# Patient Record
Sex: Female | Born: 2005
Health system: Southern US, Community
[De-identification: ages and names within clinical notes are randomized; demographics above are authoritative.]

## PROBLEM LIST (undated history)

## (undated) DIAGNOSIS — M25569 Pain in unspecified knee: Secondary | ICD-10-CM

## (undated) HISTORY — DX: Pain in unspecified knee: M25.569

---
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
## 2023-01-10 ENCOUNTER — Ambulatory Visit (INDEPENDENT_AMBULATORY_CARE_PROVIDER_SITE_OTHER): Payer: No Typology Code available for payment source | Admitting: Physician Assistant

## 2023-01-10 ENCOUNTER — Encounter: Payer: Self-pay | Admitting: Physician Assistant

## 2023-01-10 VITALS — BP 98/66 | HR 79 | Ht 63.0 in | Wt 139.0 lb

## 2023-01-10 DIAGNOSIS — R194 Change in bowel habit: Secondary | ICD-10-CM | POA: Diagnosis not present

## 2023-01-10 DIAGNOSIS — R1084 Generalized abdominal pain: Secondary | ICD-10-CM

## 2023-01-10 DIAGNOSIS — K625 Hemorrhage of anus and rectum: Secondary | ICD-10-CM

## 2023-01-10 DIAGNOSIS — R110 Nausea: Secondary | ICD-10-CM | POA: Diagnosis not present

## 2023-01-10 MED ORDER — DICYCLOMINE HCL 10 MG PO CAPS: ORAL_CAPSULE | ORAL | 5 refills | Status: DC

## 2023-01-10 MED ORDER — PLENVU 140 G PO SOLR: 1.0000 | ORAL | 0 refills | Status: DC

## 2023-01-10 NOTE — Progress Notes (Signed)
Chief Complaint: Abdominal pain and dark stools, rectal bleeding  HPI:  Cristina Joyce is an 17 y/o Caucasian female who was referred to me by Inda Coke, PA for a complaint of abdominal pain, rectal bleeding and dark stools as well as nausea.      06/06/2022 abdominal x-ray was normal.    10/07/2022 patient saw PCP and discussed lower abdominal pain which happens the most after eating, also trouble with constipation in the past.    Today, the patient tells me that she has a history of constipation diagnosed back in August 2023 when she had an x-ray.  She took laxatives but it took them a week to work and then she started having some bowel movements but has not continued them.  Tells me that her bowel habits radiate back-and-forth, mostly leaning towards constipation, but "they are all different shapes and sizes and colors".  What worried her is that 1 day she saw a dark "puddley mucousy stool".  The other time that worried her was when she saw "an ibuprofen I had taken the night before".  Also describes a lot of mucus over the past 2 to 3 months.  Describes previous CT done at outside facility and told she had ovarian cyst.  Tells me that all of the symptoms have increased over the past year or so and pretty much anytime she eats just about anything she will develop lower abdominal pain within sometimes 5 to 10 minutes of eating which feels "full and uneasy".  She will feel like she has to have a bowel movement but oftentimes cannot.  Also has occasional diarrhea.  Associated symptoms include nausea.  Also seeing some bright red blood in her stool.  Typically just a little bit on the toilet paper when wiping.    Mom denies family history of colon cancer, IBD or celiac disease.  Patient is in business school but tells me she does not have a lot of anxiety or stress.    Denies fever, chills, weight loss, vomiting or symptoms that awaken her from sleep.  Past Medical History:  Diagnosis Date   Knee  pain     History reviewed. No pertinent surgical history.  Current Outpatient Medications  Medication Sig Dispense Refill   docusate sodium (COLACE) 100 MG capsule Take 100 mg by mouth 2 (two) times daily.     No current facility-administered medications for this visit.    Allergies as of 01/10/2023   (No Known Allergies)    Family History  Problem Relation Age of Onset   Asthma Mother    Depression Mother    Diabetes Father    High Cholesterol Father    High blood pressure Father    Depression Sister    Asthma Brother    Arthritis Maternal Grandmother    Cancer Maternal Grandmother        unsure of type   Depression Maternal Grandmother    Heart disease Maternal Grandmother    High Cholesterol Maternal Grandmother    High blood pressure Maternal Grandmother    Kidney disease Maternal Grandmother    Cancer Maternal Grandfather        unsure of type; possible bone?   Heart attack Maternal Grandfather    Heart disease Maternal Grandfather    High Cholesterol Maternal Grandfather    Heart disease Paternal Grandfather    High Cholesterol Paternal Grandfather     Social History   Socioeconomic History   Marital status: Single  Spouse name: Not on file   Number of children: 0   Years of education: Not on file   Highest education level: Not on file  Occupational History   Occupation: student  Tobacco Use   Smoking status: Never   Smokeless tobacco: Never  Vaping Use   Vaping Use: Not on file  Substance and Sexual Activity   Alcohol use: No   Drug use: No   Sexual activity: Never  Other Topics Concern   Not on file  Social History Narrative   [employer] -- studying business   Social Determinants of Health   Financial Resource Strain: Not on file  Food Insecurity: Not on file  Transportation Needs: Not on file  Physical Activity: Not on file  Stress: Not on file  Social Connections: Not on file  Intimate Partner Violence: Not on file     Review of Systems:    Constitutional: No weight loss, fever or chills Skin: No rash Cardiovascular: No chest pain  Respiratory: No SOB  Gastrointestinal: See HPI and otherwise negative Genitourinary: No dysuria Neurological: No headache, dizziness or syncope Musculoskeletal: No new muscle or joint pain Hematologic: No bruising Psychiatric: No history of depression or anxiety    Physical Exam:  Vital signs: BP 98/66   Pulse 79   Ht 5\' 3"  (1.6 m)   Wt 139 lb (63 kg)   LMP 12/19/2022   BMI 24.62 kg/m   Constitutional:   Pleasant Caucasian female appears to be in NAD, Well developed, Well nourished, alert and cooperative Head:  Normocephalic and atraumatic. Eyes:   PEERL, EOMI. No icterus. Conjunctiva pink. Ears:  Normal auditory acuity. Neck:  Supple Throat: Oral cavity and pharynx without inflammation, swelling or lesion.  Respiratory: Respirations even and unlabored. Lungs clear to auscultation bilaterally.   No wheezes, crackles, or rhonchi.  Cardiovascular: Normal S1, S2. No MRG. Regular rate and rhythm. No peripheral edema, cyanosis or pallor.  Gastrointestinal:  Soft, nondistended, nontender. No rebound or guarding. Normal bowel sounds. No appreciable masses or hepatomegaly. Rectal:  Declined Msk:  Symmetrical without gross deformities. Without edema, no deformity or joint abnormality.  Neurologic:  Alert and  oriented x4;  grossly normal neurologically.  Skin:   Dry and intact without significant lesions or rashes. Psychiatric: Demonstrates good judgement and reason without abnormal affect or behaviors.  RELEVANT LABS AND IMAGING: CBC    Component Value Date/Time   WBC 5.7 10/30/2022 1115   RBC 5.21 10/30/2022 1115   HGB 13.2 10/30/2022 1115   HCT 40.5 10/30/2022 1115   PLT 265.0 10/30/2022 1115   MCV 77.7 (L) 10/30/2022 1115   MCHC 32.6 10/30/2022 1115   RDW 13.8 10/30/2022 1115   LYMPHSABS 2.2 10/30/2022 1115   MONOABS 0.5 10/30/2022 1115   EOSABS 0.1  10/30/2022 1115   BASOSABS 0.0 10/30/2022 1115    CMP     Component Value Date/Time   NA 139 10/30/2022 1115   K 4.3 10/30/2022 1115   CL 106 10/30/2022 1115   CO2 26 10/30/2022 1115   GLUCOSE 81 10/30/2022 1115   BUN 11 10/30/2022 1115   CREATININE 0.74 10/30/2022 1115   CALCIUM 9.6 10/30/2022 1115   PROT 7.1 10/30/2022 1115   ALBUMIN 4.7 10/30/2022 1115   AST 16 10/30/2022 1115   ALT 12 10/30/2022 1115   ALKPHOS 73 10/30/2022 1115   BILITOT 0.3 10/30/2022 1115    Assessment: 1.  Lower abdominal pain: For the past 1 to 2 years, worse  over the past 2 to 3 months, within 10 to 15 minutes of eating associated with nausea and a variance in bowel habits and some undigested food material, denies anxiety or stress; consider IBS versus IBD versus other 2.  Variance in bowel habits 3.  Nausea 4.  Rectal bleeding: a small amount" on toilet paper a few times; likely hemorrhoids  Plan: 1.  Discussed options with the patient.  Either treating empirically for suspected IBS versus EGD/colonoscopy.  Her and her mother would like procedures to make sure nothing else is going on.  Schedule patient for diagnostic EGD and colonoscopy in the [place] with Dr. Silverio Decamp.  Did provide the patient a detailed list risk of procedures and she agrees to proceed. Patient is appropriate for endoscopic procedure(s) in the ambulatory ([place]) setting.  2.  Prescribed Dicyclomine 10 mg 3 times daily, 20-30 minutes before meals.  #30 with 2 refills did discuss risk of constipation and taking laxatives if needed 3.  Discussed taking Align probiotic daily 4.  Patient to follow in clinic per recommendations after time of procedures.  Ellouise Newer, PA-C [facility] 01/10/2023, 11:44 AM  Cc: Inda Coke, [place]

## 2023-01-10 NOTE — Patient Instructions (Signed)
You have been scheduled for an endoscopy and colonoscopy. Please follow the written instructions given to you at your visit today. Please pick up your prep supplies at the pharmacy within the next 1-3 days. If you use inhalers (even only as needed), please bring them with you on the day of your procedure.  We have sent the following medications to your pharmacy for you to pick up at your convenience: Dicyclomine 10 mg- Take 1 tablet 20-30 minutes before meals   Please purchase the following medications over the counter and take as directed: Align-1 capsule daily  _______________________________________________________  If your blood pressure at your visit was 140/90 or greater, please contact your primary care physician to follow up on this.  _______________________________________________________  If you are age 19 or older, your body mass index should be between 23-30. Your Body mass index is 24.62 kg/m. If this is out of the aforementioned range listed, please consider follow up with your Primary Care Provider.  If you are age 19 or younger, your body mass index should be between 19-25. Your Body mass index is 24.62 kg/m. If this is out of the aformentioned range listed, please consider follow up with your Primary Care Provider.   ________________________________________________________  The [facility] providers would like to encourage you to use [facility] to communicate with providers for non-urgent requests or questions.  Due to long hold times on the telephone, sending your provider a message by [facility] may be a faster and more efficient way to get a response.  Please allow 48 business hours for a response.  Please remember that this is for non-urgent requests.  _______________________________________________________  Due to recent changes in healthcare laws, you may see the results of your imaging and laboratory studies on MyChart before your provider has had a chance to review them.   We understand that in some cases there may be results that are confusing or concerning to you. Not all laboratory results come back in the same time frame and the provider may be waiting for multiple results in order to interpret others.  Please give [place] 48 hours in order for your provider to thoroughly review all the results before contacting the office for clarification of your results.

## 2023-01-16 ENCOUNTER — Other Ambulatory Visit: Payer: Self-pay | Admitting: *Deleted

## 2023-01-16 DIAGNOSIS — R1084 Generalized abdominal pain: Secondary | ICD-10-CM

## 2023-01-16 DIAGNOSIS — K625 Hemorrhage of anus and rectum: Secondary | ICD-10-CM

## 2023-01-16 DIAGNOSIS — R194 Change in bowel habit: Secondary | ICD-10-CM

## 2023-01-16 DIAGNOSIS — R110 Nausea: Secondary | ICD-10-CM

## 2023-02-11 NOTE — Progress Notes (Signed)
Reviewed and agree with documentation and assessment and plan. K. Veena Ronon Ferger , MD   

## 2023-02-14 ENCOUNTER — Encounter: Payer: No Typology Code available for payment source | Admitting: Gastroenterology

## 2023-04-08 LAB — HM COLONOSCOPY

## 2023-05-26 NOTE — Progress Notes (Signed)
Cristina Joyce is a 17 y.o. female here for a {New prob or follow up:31724}.  History of Present Illness:   No chief complaint on file.   HPI  Anxiety: ***    Past Medical History:  Diagnosis Date   Knee pain      Social History   Tobacco Use   Smoking status: Never   Smokeless tobacco: Never  Substance Use Topics   Alcohol use: No   Drug use: No    No past surgical history on file.  Family History  Problem Relation Age of Onset   Asthma Mother    Depression Mother    Diabetes Father    High Cholesterol Father    High blood pressure Father    Depression Sister    Asthma Brother    Arthritis Maternal Grandmother    Cancer Maternal Grandmother        unsure of type   Depression Maternal Grandmother    Heart disease Maternal Grandmother    High Cholesterol Maternal Grandmother    High blood pressure Maternal Grandmother    Kidney disease Maternal Grandmother    Cancer Maternal Grandfather        unsure of type; possible bone?   Heart attack Maternal Grandfather    Heart disease Maternal Grandfather    High Cholesterol Maternal Grandfather    Heart disease Paternal Grandfather    High Cholesterol Paternal Grandfather     No Known Allergies  Current Medications:   Current Outpatient Medications:    dicyclomine (BENTYL) 10 MG capsule, Take 1 tablet by mouth 20-30 minutes before eating (3 times daily), Disp: 90 capsule, Rfl: 5   docusate sodium (COLACE) 100 MG capsule, Take 100 mg by mouth 2 (two) times daily., Disp: , Rfl:    PEG-KCl-NaCl-NaSulf-Na Asc-C (PLENVU) 140 g SOLR, Take 1 kit by mouth as directed. Use coupon: BIN: [id number] PNC: CNRX Group: [id number] ID: [id number], Disp: 1 each, Rfl: 0   Review of Systems:   ROS  Vitals:   There were no vitals filed for this visit.   There is no height or weight on file to calculate BMI.  Physical Exam:   Physical Exam  Assessment and Plan:   There are no diagnoses linked to this  encounter.   I,Rachel Rivera,acting as a Neurosurgeon for [employer], PA.,have documented all relevant documentation on the behalf of Jarold Motto, PA,as directed by  Jarold Motto, PA while in the presence of Jarold Motto, [place].  I, Isabelle Course, have reviewed all documentation for this visit. The documentation on 05/26/23 for the exam, diagnosis, procedures, and orders are all accurate and complete.  *** Jarold Motto, PA-C

## 2023-05-27 ENCOUNTER — Encounter: Payer: Self-pay | Admitting: Physician Assistant

## 2023-05-27 ENCOUNTER — Ambulatory Visit (INDEPENDENT_AMBULATORY_CARE_PROVIDER_SITE_OTHER): Payer: No Typology Code available for payment source | Admitting: Physician Assistant

## 2023-05-27 VITALS — BP 120/74 | HR 72 | Temp 98.0°F | Ht 63.0 in | Wt 132.0 lb

## 2023-05-27 DIAGNOSIS — R0989 Other specified symptoms and signs involving the circulatory and respiratory systems: Secondary | ICD-10-CM

## 2023-05-27 NOTE — Patient Instructions (Signed)
Wt Readings from Last 3 Encounters:  05/27/23 132 lb (59.9 kg) (59%, Z= 0.23)*  01/10/23 139 lb (63 kg) (71%, Z= 0.55)*  10/30/22 136 lb 4 oz (61.8 kg) (68%, Z= 0.47)*   * Growth percentiles are based on CDC (Girls, 2-20 Years) data.   124 lb

## 2023-09-15 ENCOUNTER — Other Ambulatory Visit (HOSPITAL_COMMUNITY): Payer: Self-pay | Admitting: Gastroenterology

## 2023-09-15 DIAGNOSIS — R110 Nausea: Secondary | ICD-10-CM

## 2023-11-03 ENCOUNTER — Encounter: Payer: No Typology Code available for payment source | Admitting: Physician Assistant

## 2023-11-12 ENCOUNTER — Encounter: Payer: No Typology Code available for payment source | Admitting: Physician Assistant

## 2023-11-13 LAB — COMPREHENSIVE METABOLIC PANEL
Albumin: 4.4 (ref 3.5–5.0)
Calcium: 9.4 (ref 8.7–10.7)
eGFR: 105

## 2023-11-13 LAB — HEPATIC FUNCTION PANEL
ALT: 12 U/L (ref 7–35)
AST: 15 (ref 13–35)
Alkaline Phosphatase: 75 (ref 25–125)
Bilirubin, Total: 0.3

## 2023-11-13 LAB — CBC AND DIFFERENTIAL
HCT: 41 (ref 36–46)
Hemoglobin: 13.2 (ref 12.0–16.0)
Platelets: 239 10*3/uL (ref 150–400)
WBC: 9.1

## 2023-11-13 LAB — CBC: RBC: 5.13 — AB (ref 3.87–5.11)

## 2023-11-13 LAB — BASIC METABOLIC PANEL
BUN: 11 (ref 4–21)
CO2: 27 — AB (ref 13–22)
Chloride: 107 (ref 99–108)
Creatinine: 0.8 (ref 0.5–1.1)
Glucose: 82
Potassium: 4.1 meq/L (ref 3.5–5.1)
Sodium: 139 (ref 137–147)

## 2023-11-13 LAB — TSH: TSH: 1.05 (ref 0.41–5.90)

## 2023-11-17 ENCOUNTER — Encounter: Payer: Self-pay | Admitting: Physician Assistant

## 2023-11-17 LAB — LIPASE: Lipase: 18

## 2023-11-17 LAB — C-REACTIVE PROTEIN, QUANT: CRP: 1

## 2023-11-20 ENCOUNTER — Ambulatory Visit (INDEPENDENT_AMBULATORY_CARE_PROVIDER_SITE_OTHER): Payer: No Typology Code available for payment source | Admitting: Physician Assistant

## 2023-11-20 VITALS — BP 100/70 | HR 86 | Temp 98.1°F | Ht 63.0 in | Wt 138.4 lb

## 2023-11-20 DIAGNOSIS — Z0000 Encounter for general adult medical examination without abnormal findings: Secondary | ICD-10-CM

## 2023-11-20 DIAGNOSIS — R0989 Other specified symptoms and signs involving the circulatory and respiratory systems: Secondary | ICD-10-CM | POA: Diagnosis not present

## 2023-11-20 DIAGNOSIS — R1084 Generalized abdominal pain: Secondary | ICD-10-CM

## 2023-11-20 DIAGNOSIS — L819 Disorder of pigmentation, unspecified: Secondary | ICD-10-CM | POA: Diagnosis not present

## 2023-11-20 NOTE — Patient Instructions (Signed)
It was great to see you!  Continue ongoing management   Take care,  Lelon Mast

## 2023-11-20 NOTE — Progress Notes (Signed)
Subjective:    Cristina Joyce is a 18 y.o. female and is here for a comprehensive physical exam.  HPI  There are no preventive care reminders to display for this patient.   Acute Concerns: None  Chronic Issues: GI Issues She has been experiencing worsening GI symptoms. She is following with [facility] gastroenterology for this currently She's reports experiencing vomiting episodes almost daily.  She notices that food tends to be undigested and no acid is present.  She typically vomits her last meal. Episodes can occur few minutes or few hours following her last meal.  Her symptoms have persisted since highschool but has been worsening Gastric emptying scan and x-ray scheduled with gastrone. Denies any mariajuana, alcohol use, or weight changes. Dicyclomine or Miralax did not help relief her symptoms.  She was given samples of Linzess 72 mcg to help relief her constipation. Menstrual cycles are normal other than irregular timing.   Shallow Breathing  She continues to experience episodes of being unable to take deep breathes. These episodes tend to occur about once a month. She typically would experience chest and back pain when taking a deep breath.  Her Co2 levels are normal on recent labs.  Not associated with any exercise or known asthma.   Discoloration in Toes Reports experiencing discoloration in hands in her younger years.   Not associated with any temperature changes.  Denies any associated pain. Tends to resolve on it's own.   Health Maintenance: Immunizations -- N/A Colonoscopy -- Last done on 04-08-23. Hemorrhoids and perianal skin tags. Repeat at age 7.  Mammogram -- N/A PAP -- N/A Bone Density -- N/A Diet -- normal diet Exercise -- normal exercise levels  Sleep habits -- typically wakes up several times a night. Mood -- stable  UTD with dentist? - yes UTD with eye doctor? - yes  Weight history: Wt Readings from Last 10 Encounters:  11/20/23 138 lb  6.1 oz (62.8 kg) (67%, Z= 0.44)*  05/27/23 132 lb (59.9 kg) (59%, Z= 0.23)*  01/10/23 139 lb (63 kg) (71%, Z= 0.55)*  10/30/22 136 lb 4 oz (61.8 kg) (68%, Z= 0.47)*  10/07/22 135 lb 3.2 oz (61.3 kg) (67%, Z= 0.44)*   * Growth percentiles are based on CDC (Girls, 2-20 Years) data.   Body mass index is 24.51 kg/m. Patient's last menstrual period was 11/18/2023 (exact date).  Alcohol use:  reports no history of alcohol use.  Tobacco use:  Tobacco Use: Low Risk  (11/20/2023)   Patient History    Smoking Tobacco Use: Never    Smokeless Tobacco Use: Never    Passive Exposure: Not on file   Eligible for lung cancer screening? no     11/20/2023    9:28 AM  Depression screen PHQ 2/9  Decreased Interest 0  Down, Depressed, Hopeless 0  PHQ - 2 Score 0  Altered sleeping 3  Tired, decreased energy 0  Change in appetite 0  Feeling bad or failure about yourself  0  Trouble concentrating 0  Moving slowly or fidgety/restless 0  Suicidal thoughts 0  PHQ-9 Score 3  Difficult doing work/chores Not difficult at all     Other providers/specialists: Patient Care Team: Jarold Motto, [place] as PCP - General (Physician Assistant)    PMHx, SurgHx, SocialHx, Medications, and Allergies were reviewed in the Visit Navigator and updated as appropriate.   Past Medical History:  Diagnosis Date   Knee pain     No past surgical history on file.  Family History  Problem Relation Age of Onset   Asthma Mother    Depression Mother    Diabetes Father    High Cholesterol Father    High blood pressure Father    Depression Sister    Asthma Brother    Arthritis Maternal Grandmother    Cancer Maternal Grandmother        unsure of type   Depression Maternal Grandmother    Heart disease Maternal Grandmother    High Cholesterol Maternal Grandmother    High blood pressure Maternal Grandmother    Kidney disease Maternal Grandmother    Cancer Maternal Grandfather        unsure of type; possible  bone?   Heart attack Maternal Grandfather    Heart disease Maternal Grandfather    High Cholesterol Maternal Grandfather    Heart disease Paternal Grandfather    High Cholesterol Paternal Grandfather     Social History   Tobacco Use   Smoking status: Never   Smokeless tobacco: Never  Substance Use Topics   Alcohol use: No   Drug use: No    Review of Systems:   Review of Systems  Constitutional:  Negative for chills, fever, malaise/fatigue and weight loss.  HENT:  Negative for hearing loss, sinus pain and sore throat.   Respiratory:  Negative for cough and hemoptysis.   Cardiovascular:  Negative for chest pain, palpitations, leg swelling and PND.  Gastrointestinal:  Positive for abdominal pain, constipation and vomiting. Negative for diarrhea, heartburn and nausea.  Genitourinary:  Negative for dysuria, frequency and urgency.  Musculoskeletal:  Positive for back pain and neck pain. Negative for myalgias.  Skin:  Negative for itching and rash.  Neurological:  Negative for dizziness, tingling, seizures and headaches.  Endo/Heme/Allergies:  Negative for polydipsia.  Psychiatric/Behavioral:  Negative for depression. The patient is not nervous/anxious.     Objective:   BP 100/70 (BP Location: Left Arm, Patient Position: Sitting, Cuff Size: Normal)   Pulse 86   Temp 98.1 F (36.7 C) (Temporal)   Ht 5\' 3"  (1.6 m)   Wt 138 lb 6.1 oz (62.8 kg)   LMP 11/18/2023 (Exact Date)   SpO2 98%   BMI 24.51 kg/m  Body mass index is 24.51 kg/m.   General Appearance:    Alert, cooperative, no distress, appears stated age  Head:    Normocephalic, without obvious abnormality, atraumatic  Eyes:    PERRL, conjunctiva/corneas clear, EOM's intact, fundi    benign, both eyes  Ears:    Normal TM's and external ear canals, both ears  Nose:   Nares normal, septum midline, mucosa normal, no drainage    or sinus tenderness  Throat:   Lips, mucosa, and tongue normal; teeth and gums normal  Neck:    Supple, symmetrical, trachea midline, no adenopathy;    thyroid:  no enlargement/tenderness/nodules; no carotid   bruit or JVD  Back:     Symmetric, no curvature, ROM normal, no CVA tenderness  Lungs:     Clear to auscultation bilaterally, respirations unlabored  Chest Wall:    No tenderness or deformity   Heart:    Regular rate and rhythm, S1 and S2 normal, no murmur, rub or gallop  Breast Exam:    Deferred  Abdomen:     Soft, non-tender, bowel sounds active all four quadrants,    no masses, no organomegaly  Genitalia:    Deferred  Extremities:   Extremities normal, atraumatic, no cyanosis or edema  Pulses:  2+ and symmetric all extremities  Skin:   Skin color, texture, turgor normal, no rashes or lesions  Lymph nodes:   Cervical, supraclavicular, and axillary nodes normal  Neurologic:   CNII-XII intact, normal strength, sensation and reflexes    throughout    Assessment/Plan:   Routine physical examination Today patient counseled on age appropriate routine health concerns for screening and prevention, each reviewed and up to date or declined. Immunizations reviewed and up to date or declined. Labs ordered and reviewed. Risk factors for depression reviewed and negative. Hearing function and visual acuity are intact. ADLs screened and addressed as needed. Functional ability and level of safety reviewed and appropriate. Education, counseling and referrals performed based on assessed risks today. Patient provided with a copy of personalized plan for preventive services.  Generalized abdominal pain Ongoing No red flags Continue close management with gastroenterology Continue to work on staying ahead of constipation  Shallow breathing Declines work up No red flags Continue to monitor Follow-up if new/worsening symptom(s)   Discoloration of skin of toe Currently asymptomatic  Consider differential diagnosis of Raynaud No red flag symptom(s) Follow-up if new/worsening symptom(s)    Jarold Motto, PA-C [facility] M Kadhim,acting as a Neurosurgeon for [employer], PA.,have documented all relevant documentation on the behalf of Jarold Motto, PA,as directed by  Jarold Motto, PA while in the presence of Jarold Motto, [place].   I, Jarold Motto, [place], have reviewed all documentation for this visit. The documentation on 11/20/23 for the exam, diagnosis, procedures, and orders are all accurate and complete.

## 2023-11-27 ENCOUNTER — Encounter: Payer: Self-pay | Admitting: Physician Assistant

## 2023-11-28 ENCOUNTER — Telehealth: Payer: Self-pay | Admitting: *Deleted

## 2023-11-28 ENCOUNTER — Other Ambulatory Visit (INDEPENDENT_AMBULATORY_CARE_PROVIDER_SITE_OTHER): Payer: No Typology Code available for payment source

## 2023-11-28 ENCOUNTER — Other Ambulatory Visit: Payer: Self-pay | Admitting: Physician Assistant

## 2023-11-28 DIAGNOSIS — R5383 Other fatigue: Secondary | ICD-10-CM | POA: Diagnosis not present

## 2023-11-28 DIAGNOSIS — Z136 Encounter for screening for cardiovascular disorders: Secondary | ICD-10-CM | POA: Diagnosis not present

## 2023-11-28 DIAGNOSIS — Z13220 Encounter for screening for lipoid disorders: Secondary | ICD-10-CM | POA: Diagnosis not present

## 2023-11-28 LAB — CBC WITH DIFFERENTIAL/PLATELET
Basophils Absolute: 0 10*3/uL (ref 0.0–0.1)
Basophils Relative: 0.6 % (ref 0.0–3.0)
Eosinophils Absolute: 0.1 10*3/uL (ref 0.0–0.7)
Eosinophils Relative: 1.1 % (ref 0.0–5.0)
HCT: 39.6 % (ref 36.0–49.0)
Hemoglobin: 12.9 g/dL (ref 12.0–16.0)
Lymphocytes Relative: 25.2 % (ref 24.0–48.0)
Lymphs Abs: 1.7 10*3/uL (ref 0.7–4.0)
MCHC: 32.6 g/dL (ref 31.0–37.0)
MCV: 79.7 fL (ref 78.0–98.0)
Monocytes Absolute: 0.5 10*3/uL (ref 0.1–1.0)
Monocytes Relative: 8.2 % (ref 3.0–12.0)
Neutro Abs: 4.3 10*3/uL (ref 1.4–7.7)
Neutrophils Relative %: 64.9 % (ref 43.0–71.0)
Platelets: 257 10*3/uL (ref 150.0–575.0)
RBC: 4.96 Mil/uL (ref 3.80–5.70)
RDW: 14.3 % (ref 11.4–15.5)
WBC: 6.6 10*3/uL (ref 4.5–13.5)

## 2023-11-28 LAB — COMPREHENSIVE METABOLIC PANEL
ALT: 11 U/L (ref 0–35)
AST: 15 U/L (ref 0–37)
Albumin: 4.5 g/dL (ref 3.5–5.2)
Alkaline Phosphatase: 76 U/L (ref 47–119)
BUN: 15 mg/dL (ref 6–23)
CO2: 27 meq/L (ref 19–32)
Calcium: 9.1 mg/dL (ref 8.4–10.5)
Chloride: 108 meq/L (ref 96–112)
Creatinine, Ser: 0.8 mg/dL (ref 0.40–1.20)
GFR: 106.56 mL/min (ref >60.00)
Glucose, Bld: 70 mg/dL (ref 70–99)
Potassium: 4.3 meq/L (ref 3.5–5.1)
Sodium: 141 meq/L (ref 135–145)
Total Bilirubin: 0.3 mg/dL (ref 0.2–1.2)
Total Protein: 7 g/dL (ref 6.0–8.3)

## 2023-11-28 LAB — IBC + FERRITIN
Ferritin: 4.4 ng/mL — ABNORMAL LOW (ref 10.0–291.0)
Iron: 25 ug/dL — ABNORMAL LOW (ref 42–145)
Saturation Ratios: 6.1 % — ABNORMAL LOW (ref 20.0–50.0)
TIBC: 408.8 ug/dL (ref 250.0–450.0)
Transferrin: 292 mg/dL (ref 212.0–360.0)

## 2023-11-28 LAB — LIPID PANEL
Cholesterol: 112 mg/dL (ref 0–200)
HDL: 50 mg/dL (ref >39.00)
LDL Cholesterol: 55 mg/dL (ref 0–99)
NonHDL: 61.69
Total CHOL/HDL Ratio: 2
Triglycerides: 31 mg/dL (ref 0.0–149.0)
VLDL: 6.2 mg/dL (ref 0.0–40.0)

## 2023-11-28 LAB — T3, FREE: T3, Free: 3.8 pg/mL (ref 2.3–4.2)

## 2023-11-28 LAB — VITAMIN B12: Vitamin B-12: 334 pg/mL (ref 211–911)

## 2023-11-28 LAB — SEDIMENTATION RATE: Sed Rate: 3 mm/h (ref 0–20)

## 2023-11-28 LAB — VITAMIN D 25 HYDROXY (VIT D DEFICIENCY, FRACTURES): VITD: 30.2 ng/mL (ref 30.00–100.00)

## 2023-11-28 LAB — TSH: TSH: 1.02 u[IU]/mL (ref 0.40–5.00)

## 2023-11-28 LAB — C-REACTIVE PROTEIN: CRP: <1 mg/dL (ref 0.5–20.0)

## 2023-11-28 LAB — T4, FREE: Free T4: 0.85 ng/dL (ref 0.60–1.60)

## 2023-11-28 NOTE — Telephone Encounter (Signed)
Please see message and add lab if appropriate.

## 2023-11-28 NOTE — Telephone Encounter (Signed)
Copied from CRM [id number]. Topic: Clinical - Request for Lab/Test Order >> Nov 28, 2023 11:15 AM Cristina Joyce wrote: Reason for CRM: Patient is on the way to the Lab to get testing done, patient has requested for the provider to add Alpha-gal(AGS) testing to the lab order as well.

## 2023-11-28 NOTE — Telephone Encounter (Signed)
 Noted

## 2023-11-29 LAB — RHEUMATOID FACTOR: Rheumatoid fact SerPl-aCnc: <10 [IU]/mL (ref <14)

## 2023-11-29 LAB — ANA: Anti Nuclear Antibody (ANA): NEGATIVE

## 2023-11-30 ENCOUNTER — Encounter: Payer: Self-pay | Admitting: Physician Assistant

## 2023-11-30 DIAGNOSIS — Z91018 Allergy to other foods: Secondary | ICD-10-CM

## 2023-12-01 ENCOUNTER — Encounter: Payer: Self-pay | Admitting: Physician Assistant

## 2023-12-02 LAB — CYCLIC CITRUL PEPTIDE ANTIBODY, IGG/IGA: Cyclic Citrullin Peptide Ab: 5 U (ref 0–19)

## 2023-12-03 LAB — ALPHA-GAL PANEL
Allergen, Mutton, f88: <0.1 kU/L
Allergen, Pork, f26: <0.1 kU/L
Beef: 0.19 kU/L — ABNORMAL HIGH
CLASS: 0
Class: 0
GALACTOSE-ALPHA-1,3-GALACTOSE IGE*: 0.32 kU/L — ABNORMAL HIGH (ref <0.10)

## 2023-12-03 LAB — INTERPRETATION:

## 2023-12-03 MED ORDER — EPINEPHRINE 0.3 MG/0.3ML IJ SOAJ: 0.3000 mg | INTRAMUSCULAR | 1 refills | Status: AC | PRN

## 2023-12-03 NOTE — Addendum Note (Signed)
 Addended by: Winona Haw on: 12/03/2023 04:19 PM   Modules accepted: Orders

## 2023-12-03 NOTE — Telephone Encounter (Signed)
Okay for Epi pen

## 2023-12-10 ENCOUNTER — Encounter: Payer: No Typology Code available for payment source | Admitting: Physician Assistant

## 2023-12-25 ENCOUNTER — Encounter: Payer: Self-pay | Admitting: Physician Assistant

## 2023-12-26 ENCOUNTER — Encounter: Payer: Self-pay | Admitting: Physician Assistant

## 2024-03-01 ENCOUNTER — Encounter: Payer: Self-pay | Admitting: Physician Assistant

## 2024-03-02 NOTE — Progress Notes (Signed)
 Cristina Joyce is a 18 y.o. female here for a {New prob or follow up:31724}.  History of Present Illness:   No chief complaint on file.   Breathing issues ***: Pt complains of *** starting ***.  ***    Past Medical History:  Diagnosis Date   Knee pain      Social History   Tobacco Use   Smoking status: Never   Smokeless tobacco: Never  Substance Use Topics   Alcohol use: No   Drug use: No    No past surgical history on file.  Family History  Problem Relation Age of Onset   Asthma Mother    Depression Mother    Diabetes Father    High Cholesterol Father    High blood pressure Father    Depression Sister    Asthma Brother    Arthritis Maternal Grandmother    Cancer Maternal Grandmother        unsure of type   Depression Maternal Grandmother    Heart disease Maternal Grandmother    High Cholesterol Maternal Grandmother    High blood pressure Maternal Grandmother    Kidney disease Maternal Grandmother    Cancer Maternal Grandfather        unsure of type; possible bone?   Heart attack Maternal Grandfather    Heart disease Maternal Grandfather    High Cholesterol Maternal Grandfather    Heart disease Paternal Grandfather    High Cholesterol Paternal Grandfather     No Known Allergies  Current Medications:   Current Outpatient Medications:    Acetaminophen 500 MG capsule, Take 500 mg by mouth as needed., Disp: , Rfl:    dicyclomine  (BENTYL ) 10 MG capsule, Take 1 tablet by mouth 20-30 minutes before eating (3 times daily), Disp: 90 capsule, Rfl: 5   EPINEPHrine  0.3 mg/0.3 mL IJ SOAJ injection, Inject 0.3 mg into the muscle as needed for anaphylaxis., Disp: 1 each, Rfl: 1   Review of Systems:   Negative unless otherwise specified per HPI.  Vitals:   There were no vitals filed for this visit.   There is no height or weight on file to calculate BMI.  Physical Exam:   Physical Exam  Assessment and Plan:   There are no diagnoses linked to this  encounter.   I, Bernita Bristle, acting as a Neurosurgeon for Cristina Joyce, [place]., have documented all relevant documentation on the behalf of Cristina Joyce, [place], as directed by   while in the presence of Cristina Joyce, [place].  I, Cristina Joyce, [place], have reviewed all documentation for this visit. The documentation on 03/02/24 for the exam, diagnosis, procedures, and orders are all accurate and complete.  Cristina Iba, PA-C

## 2024-03-03 ENCOUNTER — Ambulatory Visit (INDEPENDENT_AMBULATORY_CARE_PROVIDER_SITE_OTHER): Admitting: Physician Assistant

## 2024-03-03 ENCOUNTER — Ambulatory Visit (INDEPENDENT_AMBULATORY_CARE_PROVIDER_SITE_OTHER)

## 2024-03-03 VITALS — BP 108/70 | HR 98 | Temp 98.2°F | Ht 63.5 in | Wt 137.8 lb

## 2024-03-03 DIAGNOSIS — R0989 Other specified symptoms and signs involving the circulatory and respiratory systems: Secondary | ICD-10-CM

## 2024-03-03 DIAGNOSIS — D509 Iron deficiency anemia, unspecified: Secondary | ICD-10-CM

## 2024-03-03 DIAGNOSIS — Z91018 Allergy to other foods: Secondary | ICD-10-CM | POA: Diagnosis not present

## 2024-03-03 LAB — CBC WITH DIFFERENTIAL/PLATELET
Basophils Absolute: 0 10*3/uL (ref 0.0–0.1)
Basophils Relative: 0.4 % (ref 0.0–3.0)
Eosinophils Absolute: 0.1 10*3/uL (ref 0.0–0.7)
Eosinophils Relative: 0.8 % (ref 0.0–5.0)
HCT: 43.8 % (ref 36.0–46.0)
Hemoglobin: 14.5 g/dL (ref 12.0–15.0)
Lymphocytes Relative: 27.4 % (ref 12.0–46.0)
Lymphs Abs: 1.8 10*3/uL (ref 0.7–4.0)
MCHC: 33.2 g/dL (ref 30.0–36.0)
MCV: 83.8 fl (ref 78.0–100.0)
Monocytes Absolute: 0.5 10*3/uL (ref 0.1–1.0)
Monocytes Relative: 7.4 % (ref 3.0–12.0)
Neutro Abs: 4.2 10*3/uL (ref 1.4–7.7)
Neutrophils Relative %: 64 % (ref 43.0–77.0)
Platelets: 208 10*3/uL (ref 150.0–400.0)
RBC: 5.22 Mil/uL — ABNORMAL HIGH (ref 3.87–5.11)
RDW: 13.4 % (ref 11.5–14.6)
WBC: 6.6 10*3/uL (ref 4.5–10.5)

## 2024-03-03 LAB — IBC + FERRITIN
Ferritin: 6.8 ng/mL — ABNORMAL LOW (ref 10.0–291.0)
Iron: 84 ug/dL (ref 42–145)
Saturation Ratios: 22.1 % (ref 20.0–50.0)
TIBC: 380.8 ug/dL (ref 250.0–450.0)
Transferrin: 272 mg/dL (ref 212.0–360.0)

## 2024-03-03 LAB — COMPREHENSIVE METABOLIC PANEL WITH GFR
ALT: 10 U/L (ref 0–35)
AST: 14 U/L (ref 0–37)
Albumin: 4.5 g/dL (ref 3.5–5.2)
Alkaline Phosphatase: 62 U/L (ref 39–117)
BUN: 12 mg/dL (ref 6–23)
CO2: 25 meq/L (ref 19–32)
Calcium: 9.3 mg/dL (ref 8.4–10.5)
Chloride: 105 meq/L (ref 96–112)
Creatinine, Ser: 0.83 mg/dL (ref 0.40–1.20)
GFR: 101.77 mL/min (ref >60.00)
Glucose, Bld: 82 mg/dL (ref 70–99)
Potassium: 4.3 meq/L (ref 3.5–5.1)
Sodium: 137 meq/L (ref 135–145)
Total Bilirubin: 0.5 mg/dL (ref 0.2–1.2)
Total Protein: 7.1 g/dL (ref 6.0–8.3)

## 2024-03-03 LAB — VITAMIN B12: Vitamin B-12: 216 pg/mL (ref 211–911)

## 2024-03-03 NOTE — Patient Instructions (Signed)
 It was great to see you!  We will get blood work and chest xray today  We will consider referral to pulmonary if symptom(s) persist  If any sudden onset worsening of symptom(s), let me know   Take care,  Izick Gasbarro PA-C

## 2024-03-04 ENCOUNTER — Encounter: Payer: Self-pay | Admitting: Physician Assistant

## 2024-03-05 ENCOUNTER — Encounter: Payer: Self-pay | Admitting: Physician Assistant

## 2024-03-13 ENCOUNTER — Encounter: Payer: Self-pay | Admitting: Physician Assistant

## 2024-03-13 DIAGNOSIS — R000 Tachycardia, unspecified: Secondary | ICD-10-CM

## 2024-03-15 NOTE — Telephone Encounter (Signed)
Okay for referral to Cardiology

## 2024-03-24 ENCOUNTER — Encounter: Payer: Self-pay | Admitting: Physician Assistant

## 2024-03-24 DIAGNOSIS — R000 Tachycardia, unspecified: Secondary | ICD-10-CM

## 2024-03-26 ENCOUNTER — Ambulatory Visit: Admitting: Cardiology

## 2024-04-09 ENCOUNTER — Telehealth: Payer: Self-pay | Admitting: Physician Assistant

## 2024-04-09 NOTE — Telephone Encounter (Signed)
 Copied from CRM [id number]. Topic: Referral - Status >> Apr 08, 2024  3:40 PM Melissa C wrote: Reason for CRM: patient requested to see Rometta Coad. When checking referrals it looks like referral is still pending. Please advise for patient.

## 2024-04-23 ENCOUNTER — Encounter: Payer: Self-pay | Admitting: Family Medicine

## 2024-04-23 ENCOUNTER — Ambulatory Visit (INDEPENDENT_AMBULATORY_CARE_PROVIDER_SITE_OTHER): Admitting: Family Medicine

## 2024-04-23 VITALS — BP 107/66 | HR 71 | Temp 98.4°F | Ht 63.5 in | Wt 140.0 lb

## 2024-04-23 DIAGNOSIS — Q178 Other specified congenital malformations of ear: Secondary | ICD-10-CM | POA: Diagnosis not present

## 2024-04-23 DIAGNOSIS — R519 Headache, unspecified: Secondary | ICD-10-CM | POA: Diagnosis not present

## 2024-04-23 NOTE — Patient Instructions (Signed)
 It was very nice to see you today!  I will refer you to plastic surgery to repair your  earlobe.  Take care, Dr Kennyth  PLEASE NOTE:  If you had any lab tests, please let us  know if you have not heard back within a few days. You may see your results on mychart before we have a chance to review them but we will give you a call once they are reviewed by us .   If we ordered any referrals today, please let us  know if you have not heard from their office within the next week.   If you had any urgent prescriptions sent in today, please check with the pharmacy within an hour of our visit to make sure the prescription was transmitted appropriately.   Please try these tips to maintain a healthy lifestyle:  Eat at least 3 REAL meals and 1-2 snacks per day.  Aim for no more than 5 hours between eating.  If you eat breakfast, please do so within one hour of getting up.   Each meal should contain half fruits/vegetables, one quarter protein, and one quarter carbs (no bigger than a computer mouse)  Cut down on sweet beverages. This includes juice, soda, and sweet tea.   Drink at least 1 glass of water with each meal and aim for at least 8 glasses per day  Exercise at least 150 minutes every week.

## 2024-04-23 NOTE — Progress Notes (Signed)
   Cristina Joyce is a 18 y.o. female who presents today for an office visit.  Assessment/Plan:  Split earlobe Discussed with patient that we would not be able to repair in the office today.  Will place referral to plastic surgery for further evaluation management  Headache No red flags.  Not currently having any symptoms.  May be having mild migraines.  She will let us  know if symptoms become more persistent or she develops any new symptoms.    Subjective:  HPI:  Patient here with right split earlobe for a couple months.  She states that she woke up in the morning with her ear splint.  She believes it was due to wearing earrings for too long.  Not currently having any pain.  She is also occasionally getting pain on the left side of her head.  Last for a few moments and then subsides.  This is been happening the last few days.  No reported weakness or numbness.  No reported vision changes.       Objective:  Physical Exam: BP 107/66   Pulse 71   Temp 98.4 F (36.9 C) (Temporal)   Ht 5' 3.5 (1.613 m)   Wt 140 lb (63.5 kg)   LMP 03/31/2024   SpO2 98%   BMI 24.41 kg/m   Gen: No acute distress, resting comfortably HEENT: Right split earlobe noted.  The area has epithelialized without any obvious open lacerations.  Will CV: Regular rate and rhythm with no murmurs appreciated Pulm: Normal work of breathing, clear to auscultation bilaterally with no crackles, wheezes, or rhonchi Neuro: Grossly normal, moves all extremities Psych: Normal affect and thought content      Kenitha Glendinning M. Kennyth, MD 04/23/2024 2:55 PM

## 2024-05-06 DIAGNOSIS — R000 Tachycardia, unspecified: Secondary | ICD-10-CM | POA: Insufficient documentation

## 2024-05-06 DIAGNOSIS — R002 Palpitations: Secondary | ICD-10-CM | POA: Insufficient documentation

## 2024-05-06 DIAGNOSIS — R072 Precordial pain: Secondary | ICD-10-CM | POA: Insufficient documentation

## 2024-05-27 ENCOUNTER — Institutional Professional Consult (permissible substitution): Admitting: Plastic Surgery

## 2024-06-03 ENCOUNTER — Ambulatory Visit (INDEPENDENT_AMBULATORY_CARE_PROVIDER_SITE_OTHER): Payer: Self-pay | Admitting: Plastic Surgery

## 2024-06-03 ENCOUNTER — Encounter: Payer: Self-pay | Admitting: Plastic Surgery

## 2024-06-03 VITALS — BP 106/69 | HR 79 | Ht 63.0 in | Wt 135.2 lb

## 2024-06-03 DIAGNOSIS — Q178 Other specified congenital malformations of ear: Secondary | ICD-10-CM

## 2024-06-03 NOTE — Progress Notes (Signed)
   Referring Provider Job Lukes, PA [address] [place],  [place] [address]   CC:  Chief Complaint  Patient presents with   Advice Only      Cristina Joyce is an 18 y.o. female.  HPI: Cristina Joyce is a 18 year old female who presents today for evaluation of a right torn earlobe.  She believes that her earring pulled through 1 evening while sleeping approximately 6 months ago.  She would like to have the earlobe..  Allergies  Allergen Reactions   Alpha-Gal Nausea And Vomiting    Outpatient Encounter Medications as of 06/03/2024  Medication Sig   Acetaminophen 500 MG capsule Take 500 mg by mouth as needed.   cromolyn (GASTROCROM) 100 MG/5ML solution Take 200 mg by mouth as needed.   EPINEPHrine  0.3 mg/0.3 mL IJ SOAJ injection Inject 0.3 mg into the muscle as needed for anaphylaxis.   No facility-administered encounter medications on file as of 06/03/2024.     Past Medical History:  Diagnosis Date   Knee pain     No past surgical history on file.  Family History  Problem Relation Age of Onset   Asthma Mother    Depression Mother    Diabetes Father    High Cholesterol Father    High blood pressure Father    Depression Sister    Asthma Brother    Arthritis Maternal Grandmother    Cancer Maternal Grandmother        lymphoma   Depression Maternal Grandmother    Heart disease Maternal Grandmother    High Cholesterol Maternal Grandmother    High blood pressure Maternal Grandmother    Kidney disease Maternal Grandmother    Cancer Maternal Grandfather        unsure of type; possible bone?   Heart attack Maternal Grandfather    Heart disease Maternal Grandfather    High Cholesterol Maternal Grandfather    Heart disease Paternal Grandfather    High Cholesterol Paternal Grandfather     Social History   Social History Narrative   [employer] -- studying business     Review of Systems General: Denies fevers, chills, weight loss CV: Denies chest pain,  shortness of breath, palpitations Skin: Warm and well-healed right earlobe  Physical Exam    06/03/2024    9:24 AM 04/23/2024    2:30 PM 03/03/2024    9:34 AM  Vitals with BMI  Height 5' 3 5' 3.5 5' 3.5  Weight 135 lbs 3 oz 140 lbs 137 lbs 13 oz  BMI 23.96 24.41 24.02  Systolic 106 107 891  Diastolic 69 66 70  Pulse 79 71 98    General:  No acute distress,  Alert and oriented, Non-Toxic, Normal speech and affect Skin: Full-thickness complete tear of the right earlobe. Mammogram: Not applicable Assessment/Plan Torn earlobe: Patient has a full-thickness well epithelialized torn right earlobe.  I showed her how the epithelialized surfaces will be excised and how the earlobe to be repaired.  She should pierced the ear for approximately 6 months and when she does it would be best if it was pierced in front or behind the parent.  All questions were answered to her satisfaction.  Photographs were obtained today with her consent.  Will schedule her for repair in the office.  [name] 06/03/2024, 12:02 PM

## 2024-07-08 ENCOUNTER — Encounter: Payer: Self-pay | Admitting: Plastic Surgery

## 2024-07-08 ENCOUNTER — Ambulatory Visit (INDEPENDENT_AMBULATORY_CARE_PROVIDER_SITE_OTHER): Payer: Self-pay | Admitting: Plastic Surgery

## 2024-07-08 VITALS — BP 112/74 | HR 77 | Ht 64.0 in | Wt 135.0 lb

## 2024-07-08 DIAGNOSIS — Q178 Other specified congenital malformations of ear: Secondary | ICD-10-CM

## 2024-07-08 NOTE — Progress Notes (Signed)
 Procedure Note  Preoperative Dx: Split ear lobe  Postoperative Dx: Same  Procedure: Repair of right ear lobe  Anesthesia: Lidocaine 1% with 1:100,000 epinephrine  and 0.25% Sensorcaine   Indication for Procedure: repair of torn ear lobe  Description of Procedure: Risks and complications were explained to the patient including the risk of re-tearing the ear lobe if pierced to soon.  Consent was confirmed and the patient understands the risks and benefits.  The potential complications and alternatives were explained and the patient consents.  The patient expressed understanding the option of not having the procedure and the risks of a scar.  Time out was called and all information was confirmed to be correct.    The area was prepped and drapped.  Local anesthetic was injected in the subcutaneous tissues.  After waiting for the local to take affect the epithelialized surfaces were excised sharply.  After obtaining hemostasis, the surgical wound was closed with a single 5-0 Monocryl in the subcutaneous tissues and interrupted 5-0 Prolene sutures in the skin.  The surgical wound measured 1 cm.  A dressing was applied.  The patient was given instructions on how to care for the area and a follow up appointment.  Cristina Joyce tolerated the procedure well and there were no complications.

## 2024-07-15 ENCOUNTER — Ambulatory Visit (INDEPENDENT_AMBULATORY_CARE_PROVIDER_SITE_OTHER): Payer: Self-pay | Admitting: Plastic Surgery

## 2024-07-15 DIAGNOSIS — Q178 Other specified congenital malformations of ear: Secondary | ICD-10-CM

## 2024-07-15 NOTE — Progress Notes (Signed)
 Cristina Joyce returns today for removal of sutures after repair of the right earlobe.  She has had no problems and is overall happy with the results.  Examination shows the earlobe to be healing nicely.  Sutures were removed without difficulty.  Wound care was discussed.  I have asked her to not pierced the ear for 6 months and when she does to pierce it somewhere other than the repair.  Follow-up as needed.

## 2024-09-17 ENCOUNTER — Ambulatory Visit: Admitting: Physician Assistant

## 2024-09-17 ENCOUNTER — Ambulatory Visit: Payer: Self-pay | Admitting: Physician Assistant

## 2024-09-17 ENCOUNTER — Encounter: Payer: Self-pay | Admitting: Physician Assistant

## 2024-09-17 VITALS — BP 102/70 | HR 71 | Temp 98.1°F | Ht 64.0 in | Wt 141.4 lb

## 2024-09-17 DIAGNOSIS — G4700 Insomnia, unspecified: Secondary | ICD-10-CM

## 2024-09-17 DIAGNOSIS — R0789 Other chest pain: Secondary | ICD-10-CM

## 2024-09-17 DIAGNOSIS — R0602 Shortness of breath: Secondary | ICD-10-CM

## 2024-09-17 DIAGNOSIS — R233 Spontaneous ecchymoses: Secondary | ICD-10-CM

## 2024-09-17 DIAGNOSIS — D509 Iron deficiency anemia, unspecified: Secondary | ICD-10-CM | POA: Diagnosis not present

## 2024-09-17 DIAGNOSIS — R5383 Other fatigue: Secondary | ICD-10-CM

## 2024-09-17 LAB — IBC + FERRITIN
Ferritin: 22.7 ng/mL (ref 10.0–291.0)
Iron: 72 ug/dL (ref 42–145)
Saturation Ratios: 22.3 % (ref 20.0–50.0)
TIBC: 323.4 ug/dL (ref 250.0–450.0)
Transferrin: 231 mg/dL (ref 212.0–360.0)

## 2024-09-17 LAB — VITAMIN B12: Vitamin B-12: 838 pg/mL (ref 211–911)

## 2024-09-17 LAB — TSH: TSH: 1.1 u[IU]/mL (ref 0.35–5.50)
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
## 2024-09-29 ENCOUNTER — Encounter: Payer: Self-pay | Admitting: Physician Assistant

## 2024-09-30 ENCOUNTER — Other Ambulatory Visit: Payer: Self-pay | Admitting: Physician Assistant

## 2024-09-30 MED ORDER — TRAZODONE HCL 50 MG PO TABS: 25.0000 mg | ORAL_TABLET | Freq: Every evening | ORAL | 1 refills | Status: AC | PRN

## 2024-09-30 NOTE — Telephone Encounter (Signed)
 Please review and advise. Tks

## 2024-11-22 ENCOUNTER — Encounter: Payer: No Typology Code available for payment source | Admitting: Physician Assistant

## 2025-01-04 ENCOUNTER — Encounter: Admitting: Physician Assistant
